# Patient Record
Sex: Female | Born: 1945 | Race: Black or African American | Hispanic: No | Marital: Single | Smoking: Current every day smoker
Health system: Southern US, Community
[De-identification: ages and names within clinical notes are randomized; demographics above are authoritative.]

## PROBLEM LIST (undated history)

## (undated) DIAGNOSIS — I10 Essential (primary) hypertension: Secondary | ICD-10-CM

---

## 2005-04-20 ENCOUNTER — Ambulatory Visit: Payer: Self-pay

## 2005-09-17 ENCOUNTER — Inpatient Hospital Stay: Payer: Self-pay | Admitting: Internal Medicine

## 2005-09-17 ENCOUNTER — Other Ambulatory Visit: Payer: Self-pay

## 2007-01-03 ENCOUNTER — Ambulatory Visit: Payer: Self-pay | Admitting: Family Medicine

## 2007-02-14 ENCOUNTER — Encounter: Payer: Self-pay | Admitting: Family Medicine

## 2008-12-25 ENCOUNTER — Ambulatory Visit: Payer: Self-pay | Admitting: Family Medicine

## 2009-03-01 ENCOUNTER — Encounter: Payer: Self-pay | Admitting: Family Medicine

## 2009-03-05 ENCOUNTER — Emergency Department: Payer: Self-pay | Admitting: Emergency Medicine

## 2009-03-30 ENCOUNTER — Encounter: Payer: Self-pay | Admitting: Family Medicine

## 2009-04-05 ENCOUNTER — Emergency Department: Payer: Self-pay | Admitting: Unknown Physician Specialty

## 2009-04-18 ENCOUNTER — Inpatient Hospital Stay: Payer: Self-pay | Admitting: Internal Medicine

## 2009-05-01 ENCOUNTER — Emergency Department: Payer: Self-pay | Admitting: Emergency Medicine

## 2009-06-01 ENCOUNTER — Inpatient Hospital Stay: Payer: Self-pay | Admitting: Internal Medicine

## 2009-08-07 ENCOUNTER — Emergency Department: Payer: Self-pay | Admitting: Emergency Medicine

## 2009-09-20 ENCOUNTER — Emergency Department: Payer: Self-pay | Admitting: Emergency Medicine

## 2011-11-22 ENCOUNTER — Inpatient Hospital Stay: Payer: Self-pay | Admitting: Internal Medicine

## 2011-11-22 LAB — ACETAMINOPHEN LEVEL: Acetaminophen: <2 ug/mL

## 2011-11-22 LAB — CBC
HCT: 49.4 % — ABNORMAL HIGH (ref 35.0–47.0)
MCHC: 32.3 g/dL (ref 32.0–36.0)
Platelet: 120 10*3/uL — ABNORMAL LOW (ref 150–440)
RBC: 5.31 10*6/uL — ABNORMAL HIGH (ref 3.80–5.20)
WBC: 9.5 10*3/uL (ref 3.6–11.0)

## 2011-11-22 LAB — URINALYSIS, COMPLETE
Bilirubin,UR: NEGATIVE
Leukocyte Esterase: NEGATIVE
Ph: 5 (ref 4.5–8.0)
Protein: 30
RBC,UR: 3 /HPF (ref 0–5)

## 2011-11-22 LAB — COMPREHENSIVE METABOLIC PANEL
Albumin: 3.7 g/dL (ref 3.4–5.0)
Alkaline Phosphatase: 70 U/L (ref 50–136)
BUN: 23 mg/dL — ABNORMAL HIGH (ref 7–18)
Calcium, Total: 9.9 mg/dL (ref 8.5–10.1)
Creatinine: 1.05 mg/dL (ref 0.60–1.30)
EGFR (African American): >60
EGFR (Non-African Amer.): 56 — ABNORMAL LOW
Glucose: 118 mg/dL — ABNORMAL HIGH (ref 65–99)
SGOT(AST): 28 U/L (ref 15–37)
Sodium: 136 mmol/L (ref 136–145)
Total Protein: 7.8 g/dL (ref 6.4–8.2)

## 2011-11-22 LAB — DRUG SCREEN, URINE
Amphetamines, Ur Screen: NEGATIVE (ref ?–1000)
Barbiturates, Ur Screen: NEGATIVE (ref ?–200)
Benzodiazepine, Ur Scrn: NEGATIVE (ref ?–200)
Cannabinoid 50 Ng, Ur ~~LOC~~: NEGATIVE (ref ?–50)
Methadone, Ur Screen: NEGATIVE (ref ?–300)
Phencyclidine (PCP) Ur S: NEGATIVE (ref ?–25)
Tricyclic, Ur Screen: NEGATIVE (ref ?–1000)

## 2011-11-22 LAB — ETHANOL
Ethanol %: <0.003 % (ref 0.000–0.080)
Ethanol: <3 mg/dL

## 2011-11-22 LAB — SALICYLATE LEVEL: Salicylates, Serum: 3.7 mg/dL — ABNORMAL HIGH

## 2011-11-23 LAB — BASIC METABOLIC PANEL
Anion Gap: 17 — ABNORMAL HIGH (ref 7–16)
BUN: 26 mg/dL — ABNORMAL HIGH (ref 7–18)
Calcium, Total: 9.3 mg/dL (ref 8.5–10.1)
Co2: 18 mmol/L — ABNORMAL LOW (ref 21–32)
Creatinine: 0.89 mg/dL (ref 0.60–1.30)
EGFR (African American): >60
EGFR (Non-African Amer.): >60
Glucose: 102 mg/dL — ABNORMAL HIGH (ref 65–99)
Sodium: 137 mmol/L (ref 136–145)

## 2011-11-23 LAB — CK TOTAL AND CKMB (NOT AT ARMC)
CK, Total: 74 U/L (ref 21–215)
CK, Total: 130 U/L (ref 21–215)
CK, Total: 44 U/L (ref 21–215)
CK-MB: 7 ng/mL — ABNORMAL HIGH (ref 0.5–3.6)
CK-MB: 7.2 ng/mL — ABNORMAL HIGH (ref 0.5–3.6)
CK-MB: 4.7 ng/mL — ABNORMAL HIGH (ref 0.5–3.6)

## 2011-11-23 LAB — CBC WITH DIFFERENTIAL/PLATELET
Basophil %: 0.5 %
Eosinophil #: 0 10*3/uL (ref 0.0–0.7)
Eosinophil %: 0.1 %
HGB: 15.1 g/dL (ref 12.0–16.0)
Lymphocyte #: 1.8 10*3/uL (ref 1.0–3.6)
Lymphocyte %: 18.8 %
MCH: 29.9 pg (ref 26.0–34.0)
Monocyte #: 0.8 10*3/uL — ABNORMAL HIGH (ref 0.0–0.7)
Neutrophil %: 72 %
Platelet: 121 10*3/uL — ABNORMAL LOW (ref 150–440)
RBC: 5.05 10*6/uL (ref 3.80–5.20)
RDW: 17 % — ABNORMAL HIGH (ref 11.5–14.5)
WBC: 9.4 10*3/uL (ref 3.6–11.0)

## 2011-11-23 LAB — TROPONIN I: Troponin-I: 0.09 ng/mL — ABNORMAL HIGH

## 2011-11-24 LAB — BASIC METABOLIC PANEL
Anion Gap: 11 (ref 7–16)
BUN: 23 mg/dL — ABNORMAL HIGH (ref 7–18)
Calcium, Total: 8.9 mg/dL (ref 8.5–10.1)
Co2: 24 mmol/L (ref 21–32)
EGFR (African American): >60
Potassium: 4.1 mmol/L (ref 3.5–5.1)

## 2011-11-27 LAB — PLATELET COUNT: Platelet: 154 10*3/uL (ref 150–440)

## 2011-11-28 LAB — CBC WITH DIFFERENTIAL/PLATELET
Eosinophil %: 0.1 %
HCT: 40 % (ref 35.0–47.0)
HGB: 12.7 g/dL (ref 12.0–16.0)
Lymphocyte #: 2.1 10*3/uL (ref 1.0–3.6)
Lymphocyte %: 23.2 %
MCH: 29.8 pg (ref 26.0–34.0)
MCHC: 31.8 g/dL — ABNORMAL LOW (ref 32.0–36.0)
Monocyte %: 12.9 %

## 2011-11-28 LAB — CULTURE, BLOOD (SINGLE)

## 2011-11-28 LAB — BASIC METABOLIC PANEL
Calcium, Total: 8.7 mg/dL (ref 8.5–10.1)
Co2: 25 mmol/L (ref 21–32)
Creatinine: 0.9 mg/dL (ref 0.60–1.30)
EGFR (African American): >60
Glucose: 115 mg/dL — ABNORMAL HIGH (ref 65–99)
Osmolality: 283 (ref 275–301)
Potassium: 4 mmol/L (ref 3.5–5.1)
Sodium: 141 mmol/L (ref 136–145)

## 2011-12-02 LAB — CBC WITH DIFFERENTIAL/PLATELET
Basophil #: 0 10*3/uL (ref 0.0–0.1)
Eosinophil #: 0.1 10*3/uL (ref 0.0–0.7)
HCT: 41.8 % (ref 35.0–47.0)
HGB: 13.5 g/dL (ref 12.0–16.0)
Lymphocyte #: 2.9 10*3/uL (ref 1.0–3.6)
MCV: 92 fL (ref 80–100)
Neutrophil #: 8.8 10*3/uL — ABNORMAL HIGH (ref 1.4–6.5)
RBC: 4.54 10*6/uL (ref 3.80–5.20)
WBC: 13.1 10*3/uL — ABNORMAL HIGH (ref 3.6–11.0)

## 2011-12-02 LAB — URINALYSIS, COMPLETE
Bilirubin,UR: NEGATIVE
Nitrite: POSITIVE
Protein: NEGATIVE
RBC,UR: 4 /HPF (ref 0–5)
Specific Gravity: 1.013 (ref 1.003–1.030)
WBC UR: 310 /HPF (ref 0–5)

## 2011-12-02 LAB — BASIC METABOLIC PANEL
BUN: 21 mg/dL — ABNORMAL HIGH (ref 7–18)
Calcium, Total: 9.1 mg/dL (ref 8.5–10.1)
Chloride: 103 mmol/L (ref 98–107)
Creatinine: 0.78 mg/dL (ref 0.60–1.30)
Osmolality: 276 (ref 275–301)
Potassium: 5.1 mmol/L (ref 3.5–5.1)

## 2011-12-03 LAB — CBC WITH DIFFERENTIAL/PLATELET
Basophil #: 0.1 10*3/uL (ref 0.0–0.1)
Basophil %: 0.6 %
Eosinophil #: 0 10*3/uL (ref 0.0–0.7)
Eosinophil %: 0.4 %
HGB: 13.4 g/dL (ref 12.0–16.0)
Lymphocyte #: 2.8 10*3/uL (ref 1.0–3.6)
Lymphocyte %: 24.5 %
MCV: 93 fL (ref 80–100)
Monocyte #: 1.3 10*3/uL — ABNORMAL HIGH (ref 0.0–0.7)
Neutrophil %: 63.2 %
Platelet: 217 10*3/uL (ref 150–440)
RBC: 4.57 10*6/uL (ref 3.80–5.20)
WBC: 11.6 10*3/uL — ABNORMAL HIGH (ref 3.6–11.0)

## 2011-12-04 LAB — POTASSIUM: Potassium: 5.1 mmol/L (ref 3.5–5.1)

## 2012-09-10 ENCOUNTER — Ambulatory Visit: Payer: Self-pay | Admitting: Family Medicine

## 2013-02-10 IMAGING — CR DG CHEST 2V
1 series · 2 of 2 positions shown · non-contrast
Comparison: none

REASON FOR EXAM: ams
COMMENTS:   May transport without cardiac monitor

PROCEDURE:     DXR - DXR CHEST PA (OR AP) AND LATERAL  - November 22, 2011  [DATE]
RESULT:     Comparison is made to the study 07 August, 2009.
There is a fracture in the posterior right ninth rib without evidence of
pleural effusion or definite pneumothorax. The heart size appears normal.

[Series 1: x chest ap · 0.14mm/px · 2 of 2 slices shown]
[im 1/2]
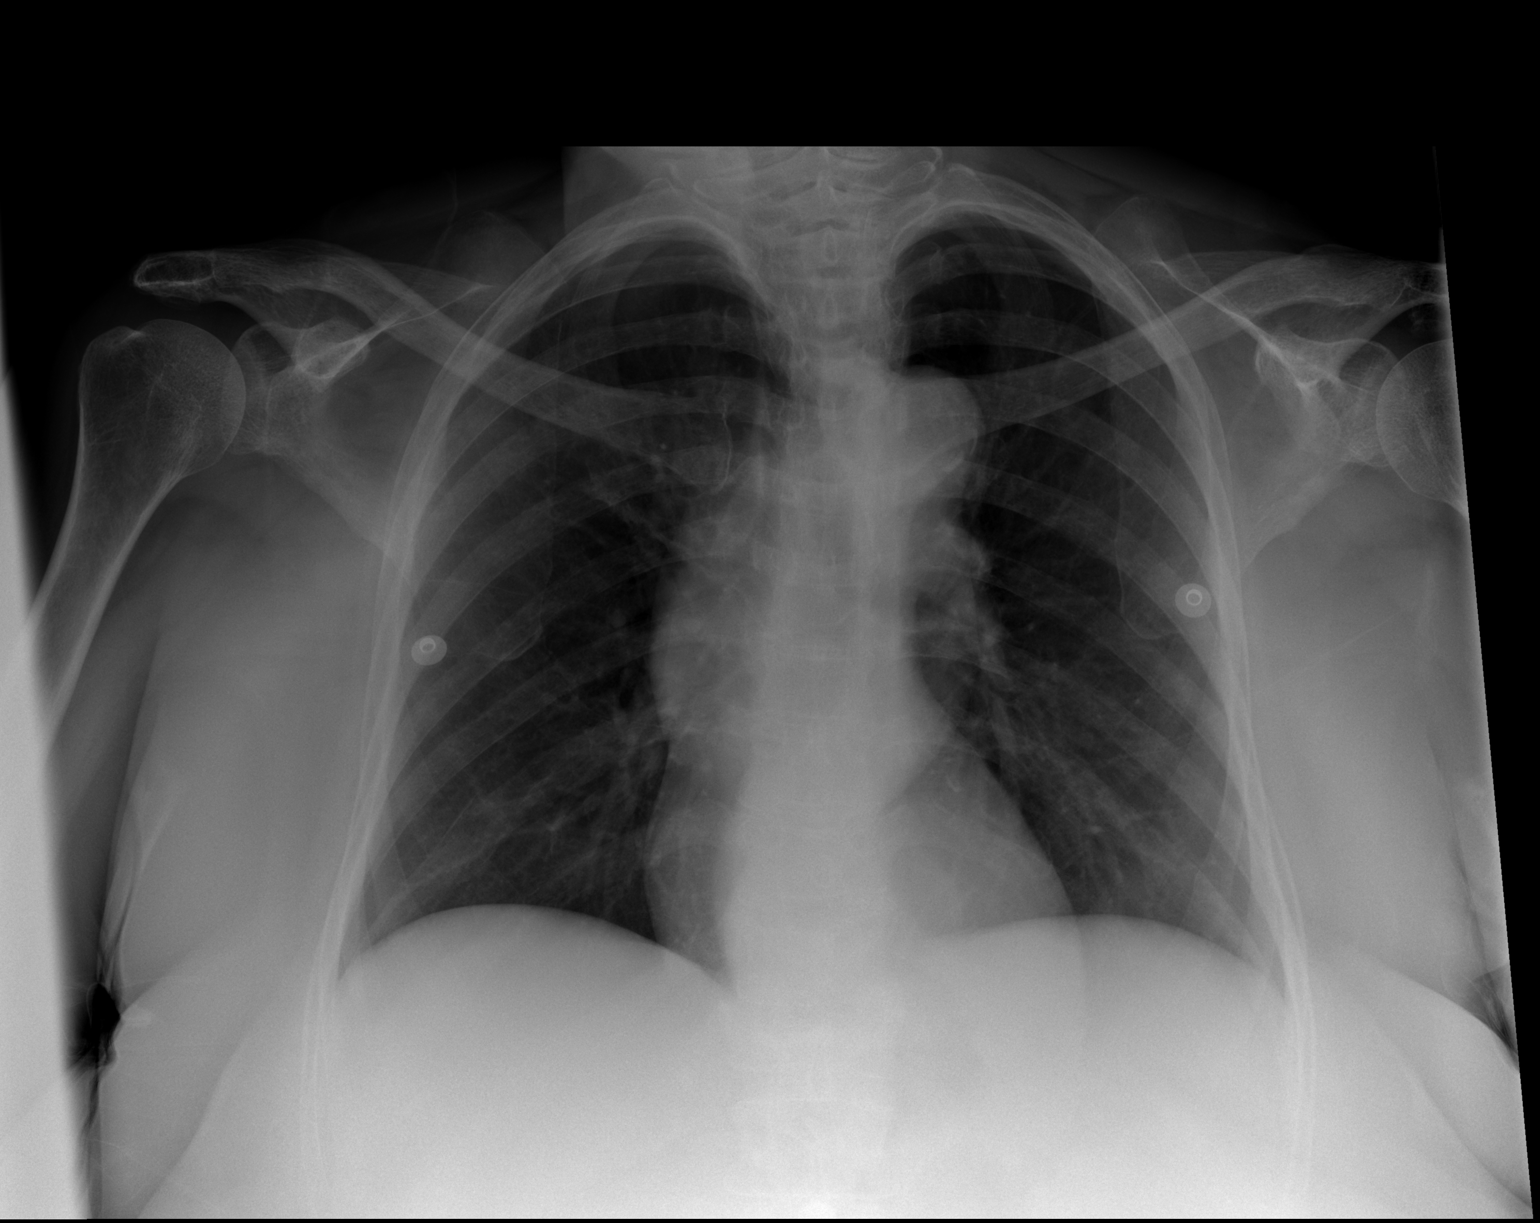
[im 2/2]
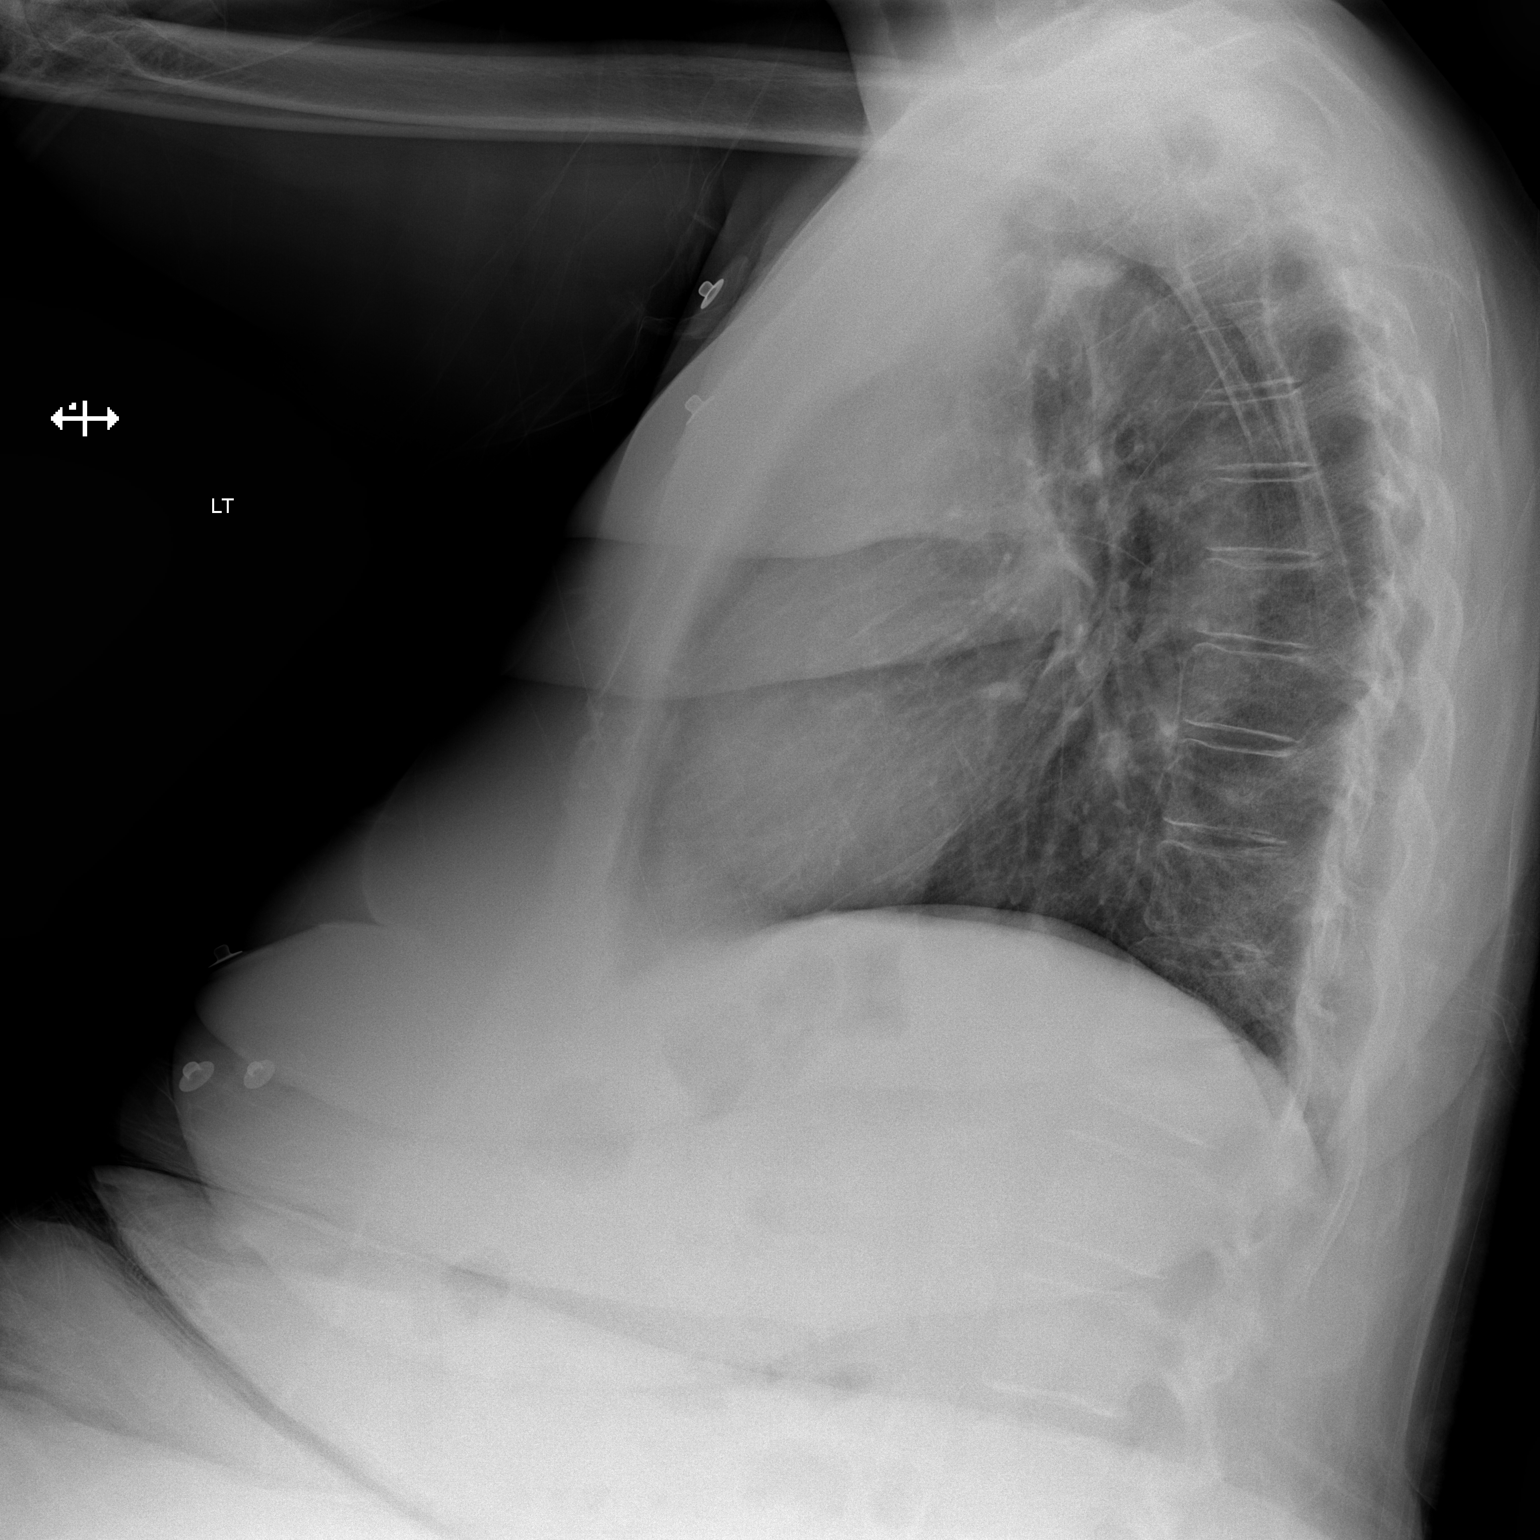

[2 of 2 positions shown; findings below may reference images not displayed]

IMPRESSION: Posterior right ninth rib fracture.

## 2013-02-10 IMAGING — CT CT CERVICAL SPINE WITHOUT CONTRAST
1 series · 12 of 14 positions shown, 15 images · non-contrast
Comparison: None

REASON FOR EXAM: fall with head injury and AMS, cannot r/o injury w/ nexus
COMMENTS:

PROCEDURE:     CT  - CT CERVICAL SPINE WO  - November 22, 2011  [DATE]
RESULT:     Clinical Indication: Trauma
TECHNIQUE: Multiple axial CT images from the skull base to the mid vertebral
body of T1. obtained with sagittal and coronal reformatted images provided.

[Series 7: axial · axial · 0.33mm/px · z∈[-347,-211]mm · 12 of 84 slices shown, 15 images]
[im 7/84  soft-tissue]
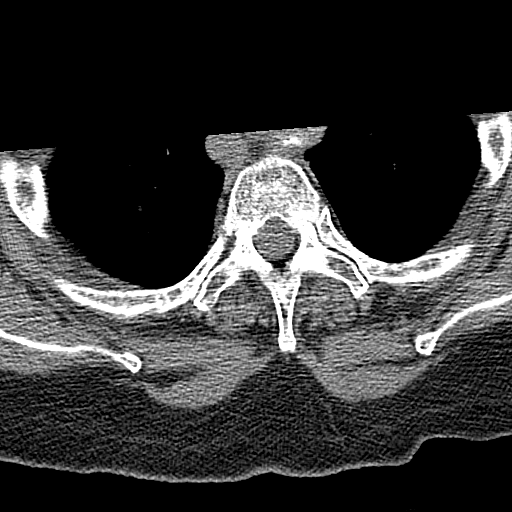
[im 7/84  bone]
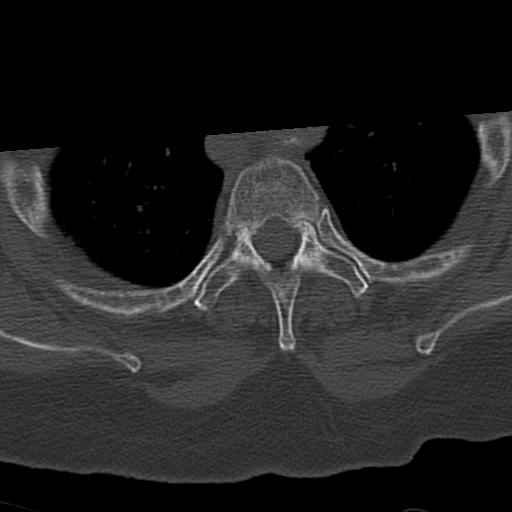
[im 13/84  bone]
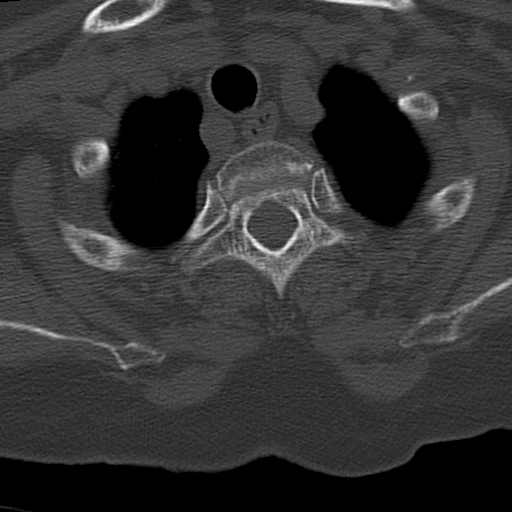
[im 20/84  bone]
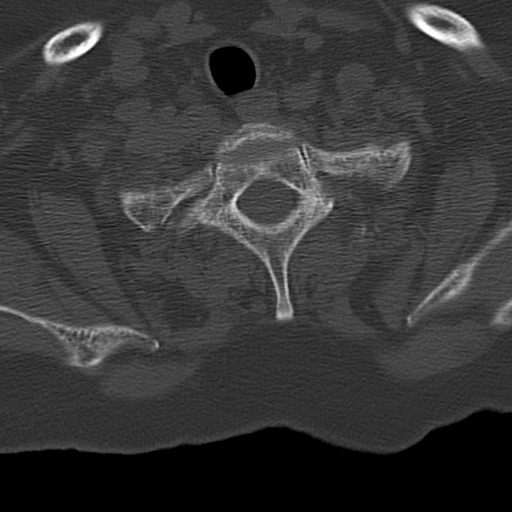
[im 26/84  bone]
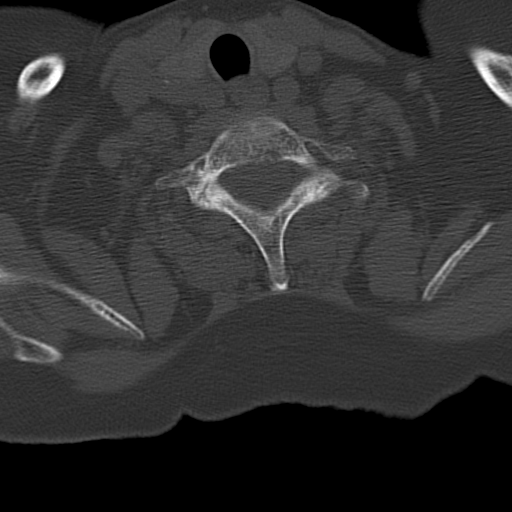
[im 32/84  soft-tissue]
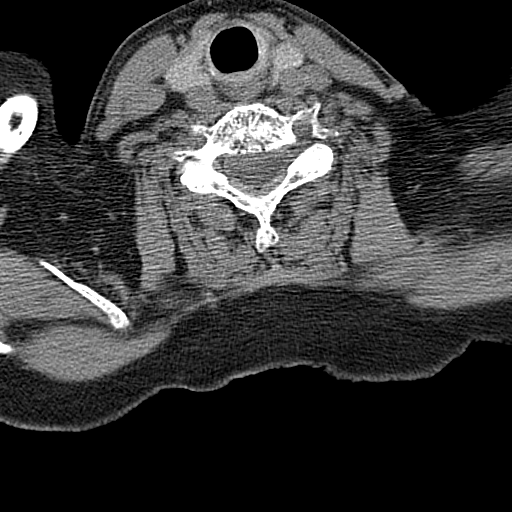
[im 32/84  bone]
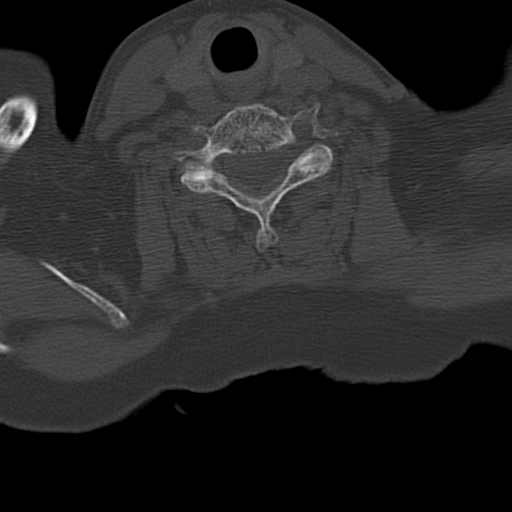
[im 39/84  bone]
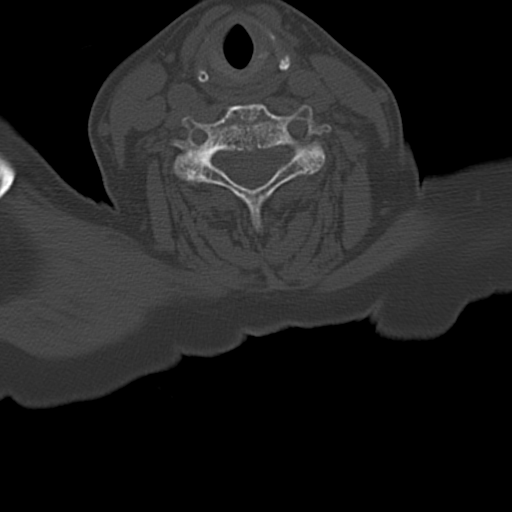
[im 45/84  bone]
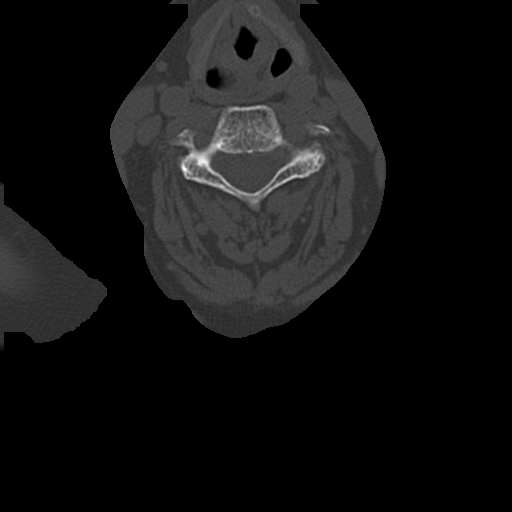
[im 52/84  bone]
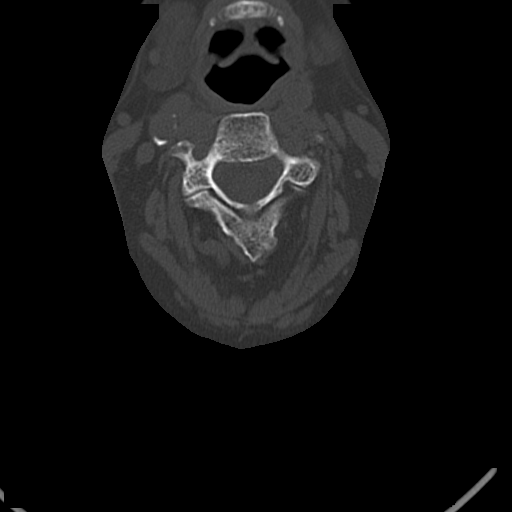
[im 58/84  soft-tissue]
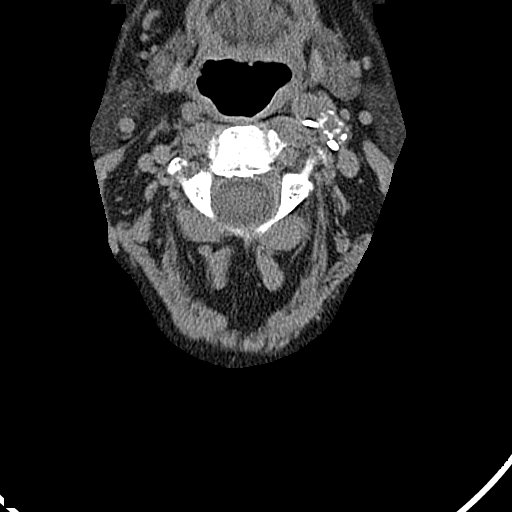
[im 58/84  bone]
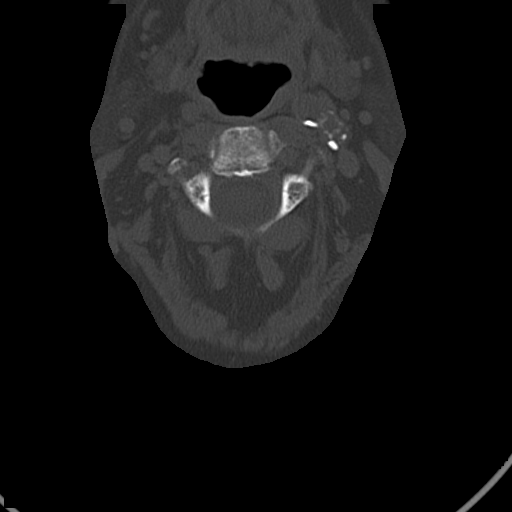
[im 64/84  bone]
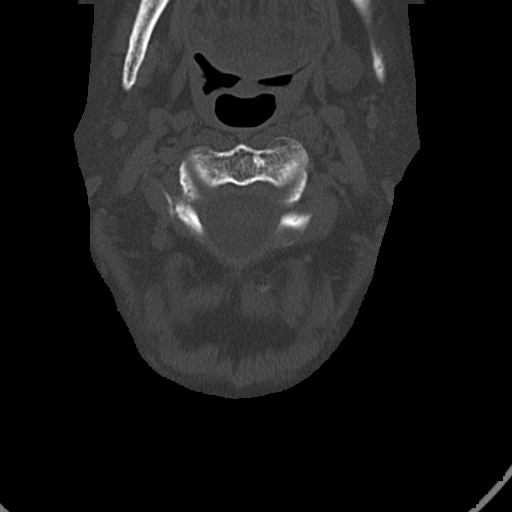
[im 71/84  bone]
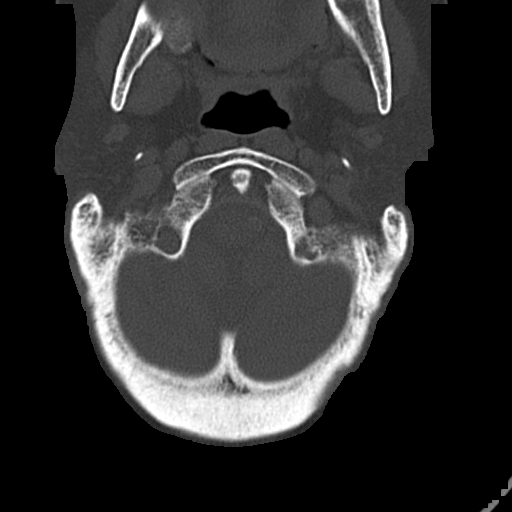
[im 77/84  bone]
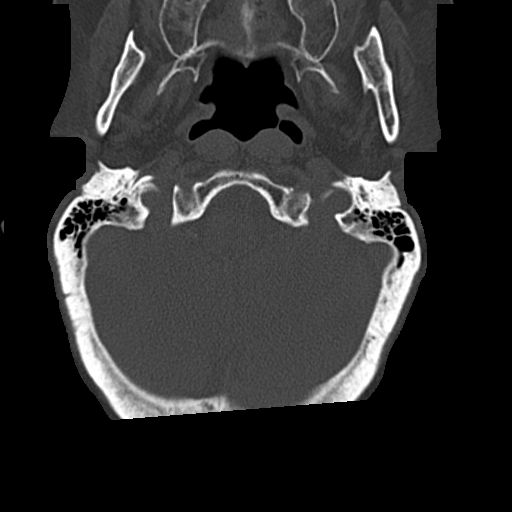

[12 of 14 positions shown; findings below may reference images not displayed]

FINDINGS: The alignment is anatomic. The vertebral body heights are maintained. There
is no acute fracture or static listhesis. The prevertebral soft tissues are
normal. The intraspinal soft tissues are not fully imaged on this
examination due to poor soft tissue contrast, but there is no soft tissue
gross abnormality.

The disc spaces are maintained.

The visualized portions of the lung apices demonstrate no focal abnormality.

There is bilateral carotid artery atherosclerosis.
IMPRESSION: 1. No acute osseous injury of the cervical spine.

2. Ligamentous injury is not evaluated. If there is high clinical concern
for ligamentous injury, consider MRI or flexion/extension radiographs as
clinically indicated and tolerated.

## 2014-08-28 ENCOUNTER — Other Ambulatory Visit (HOSPITAL_COMMUNITY): Payer: Self-pay | Admitting: *Deleted

## 2014-08-28 DIAGNOSIS — R609 Edema, unspecified: Secondary | ICD-10-CM

## 2014-08-31 ENCOUNTER — Ambulatory Visit: Payer: Self-pay | Admitting: Orthopedic Surgery

## 2014-09-08 ENCOUNTER — Other Ambulatory Visit: Payer: Self-pay

## 2014-09-08 ENCOUNTER — Other Ambulatory Visit (INDEPENDENT_AMBULATORY_CARE_PROVIDER_SITE_OTHER): Payer: Medicare Other

## 2014-09-08 DIAGNOSIS — I340 Nonrheumatic mitral (valve) insufficiency: Secondary | ICD-10-CM

## 2014-09-08 DIAGNOSIS — R609 Edema, unspecified: Secondary | ICD-10-CM

## 2015-02-21 NOTE — Consult Note (Signed)
Brief Consult Note: Diagnosis: delirium due to medication misuse, resolving. Polysubstance dep, dementia.   Patient was seen by consultant.   Consult note dictated.   Comments: Psychiatry: Patient seen. I also spoke with her son. Patient has been increasingly confused and intermittantly agitated. Son says he has clear reason to believe she misuses her prescription medicatio including the trazadone, ultram and neurontin. Also she still drinks regularly and occasionally abuses cocaine. Patient currently having signs of both delirium and dementia. Sounds like the delirium is prob better than it was earlier.  1)Stop the ultram and minimize anything that might be psychoactive. She isn't having physical withdrawl. 2) She is not in any shape at this stage of her life to benifit from any SA treatment other than simply being prevented from having access to substances.  3) I agree she would not be safe going home. Suggest placement.  Electronic Signatures: Audery Amellapacs, Keana Dueitt T (MD)  (Signed 24-Jan-13 19:37)  Authored: Brief Consult Note   Last Updated: 24-Jan-13 19:37 by Audery Amellapacs, Eryck Negron T (MD)

## 2015-02-21 NOTE — Consult Note (Signed)
PATIENT NAME:  Carla Stanley, Carla Stanley MR#:  [id number] DATE OF BIRTH:  July 21, 1946  DATE OF CONSULTATION:  11/23/2011  REFERRING PHYSICIAN:   CONSULTING PHYSICIAN:  Audery AmelJohn T. Clapacs, MD  IDENTIFYING INFORMATION AND REASON FOR CONSULT: 69 year old woman brought in to the hospital by her son because of acute confusion and instability. Consult requested because of mental status change, history of substance abuse.   HISTORY OF PRESENT ILLNESS: Information obtained from the patient and from the old chart as well as from a conversation I had with her son. The patient is a questionable historian at best. She does admit that she has been weak recently but cannot give me much explanation for it. Her chief complaint for being in the hospital is that she has "spots on her legs". She admits that she still drinks alcohol, mostly beer and sometimes liquor, regularly. She cannot remember her last drink but says that she usually has some most days. She admits that she continues to use cocaine occasionally but cannot remember when she last had any. She denies that she has been abusing any narcotics. She does not report any mood symptoms. Does not report any suicidality. The son reports that he got a call today that the patient was confused, agitated and had been found laying on the floor for hours unable to get up. The son reports that the patient has a history of intermittent confusion and this has been progressing recently. He says he has reason to believe that she has been abusing her prescription medications. He found many bottles of medication around the house which had fewer pills in them then they ought to. He told me he thinks that she has been getting pain medication prescribed more than once a week. He says that he has watched her gobble down pills indiscriminately. He also was aware that she drinks still and will get cocaine at times. He is very concerned that she is unable to take care of herself. When he found her today he  says that she was very confused, could not recognize him, could not talk coherently, that her eyes were disconjugate. The son would really like to have her placed in an assisted living facility.   PAST PSYCHIATRIC HISTORY: Patient has a well documented long history of drug abuse including a past history of heroin dependence and abuse of other opiates. She has had visits to the hospital in the past with similar degrees of confusion probably under the same circumstances. A couple of years ago it was also recommended that she be placed in a facility but somehow this did not happen. No identified history of suicide behavior. Has had diagnosis of depression in the past but it is not clear that those have been really separate from her substance abuse.    PAST MEDICAL HISTORY:  1. High blood pressure. 2. Chronic pain. 3. History of alcohol abuse.  4. Possible hepatitis.  5. Has had falls in the past.  6. History of a stroke in the past.   SOCIAL HISTORY: Apparently she lives more or less on her own right now. She has a female friend who is in and out of the house. Her daughter lives nearby and checks on her. There is a home health aide who comes in the mornings. Son lives in [place].   REVIEW OF SYSTEMS: Patient only complained to me of having spots on her legs. She said she was not in any pain, denied any shortness of breath, denied any nausea. She did  say that her appetite had been poor recently and that she feels tired. Denies depressed mood. Denies any suicidal ideation. Otherwise noncontributory.   MENTAL STATUS EXAM: Patient was awake and cooperative. Made good eye contact. Psychomotor activity very limited. Speech was slurred and difficult to understand, also very slow and quiet. Affect was flat and seemed confused. Mood was stated as being okay. Thoughts were slow and confused. She was oriented to the place and to the basic situation although there were a few moments when she seemed to get  distracted and muttered to herself. She denies suicidal or homicidal ideation. Denies that she has been hallucinating. On Mini-Mental Status exam she scored 12 out of 30.   MEDICATIONS: The medication list in the admission note includes:  1. Neurontin 800 mg 4 times a day.  2. Trazodone 200 mg at night. 3. Cymbalta 30 mg twice a day. 4. Quinapril 20 mg a day.  5. KCl 20 mEq a day.  6. Tramadol 50 mg every six hours as needed for pain.  7. Toprol 25 mg extended release a day. 8. Lasix 40 mg a day. 9. Ibuprofen as needed.   LABORATORY, DIAGNOSTIC AND RADIOLOGICAL DATA: The patient's drug screen is negative on admission. Her alcohol level was undetectable. Other abnormalities pertaining to medical illnesses noted but those are the most pertinent ones to her mental status currently.   ASSESSMENT: This is a 69 year old woman who currently is clinically showing symptoms of both delirium and dementia. Based on the history I am hearing it sounds like she was even more delirious when she came into the hospital and that it is clearing up. Obviously there can be multiple medical reasons for delirium which are being worked up. The most obvious acute one that we know of would be that she is known to abuse her medication and alcohol and cocaine. Patient is also significantly demented. It is possible that this might improve with some time but I suspect that she has significant impairment based on current exam and her son's history. Right now her vital signs are stable. There is no sign that she is having alcohol withdrawal symptoms. I doubt that this delirium is delirium tremens, especially since it sounds like it has been getting better today without the use of benzodiazepines.   TREATMENT PLAN AND RECOMMENDATIONS: I would minimize the use of any kind of medications that could be psychoactive. I think that has probably already been done. I am going to go ahead and discontinued the Ultram just so that she does not  get any more of that. She does not look like she needs specific detox treatment right now. I do not think that in her mental state that even when she comes back to baseline that she would benefit from any kind of substance abuse treatment except being put in an environment where she cannot abuse substances. I do not think there is an acute mood disorder or psychotic disorder. I agree with her son that she is really not safe back at home and that at this point she needs to be placed in facility for her own safety or these kinds of things are just going to continue to happen and get worse. Actually the patient was somewhat agreeable to that as well when I spoke to her.   DIAGNOSIS PRINCIPLE AND PRIMARY:  AXIS I: Delirium due to medication abuse, resolving.   SECONDARY DIAGNOSES:  AXIS I:  1. 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  2. Polysubstance dependence.  AXIS II: Deferred.   AXIS III: Multiple medical problems.   AXIS IV: Moderate to severe-Ongoing stress from poor social situation.     AXIS V: Functioning at time of evaluation 20.  ____________________________ Audery Amel, MD jtc:cms D: 11/23/2011 19:49:12 ET T: 11/24/2011 07:34:01 ET JOB#: [id number]  cc: Audery Amel, MD, <Dictator> Audery Amel MD ELECTRONICALLY SIGNED 11/24/2011 10:12

## 2015-02-21 NOTE — Discharge Summary (Signed)
PATIENT NAME:  Carla Stanley, Carla Stanley MR#:  [id number] DATE OF BIRTH:  October 13, 1946  DATE OF ADMISSION:  11/22/2011 DATE OF DISCHARGE:  12/07/2011  ADMITTING PHYSICIAN:  Dr. Darrick Meigs REFERRING PHYSICIAN:  Dr. Glenetta Hew  PRIMARY CARE PHYSICIAN: Dr. Maryruth Hancock   ADMITTING DIAGNOSES:  1. Altered mental status.  2. Polysubstance abuse.   DISCHARGE DIAGNOSES:  1. Acute encephalopathy, felt to be due to substance abuse and alcohol abuse, now mental status significantly improved.  2. Urinary tract infection secondary to E. coli currently on treatment with Cipro.  3. Accelerated hypertension felt to be due to agitation, now blood pressure is running low.  Her antihypertensives are discontinued.  4. Slightly elevated troponins due to malignant hypertension.  5. Tobacco abuse.  6. Chronic pain syndrome.  7. Depression.  8. Likely chronic obstructive pulmonary disease with wheezing noted during hospitalization.  9. Hyperglycemia, felt to be reactive.  10. Debilitation.   CONSULTANTS: Dr. Toni Amend, psychiatry.   PERTINENT LABS/EVALUATIONS: BMP on admission: Glucose 118, BUN 23, creatinine 1.05, sodium 136, potassium 3.6, CO2 99. Alcohol level was less than 3. LFTs were normal. Troponin was 0.12, 0.15. TUDS was negative. Admitting WBC count 9.5, hemoglobin 16, platelet count 120, platelet count on 02/03 was normal at 217. Blood cultures: No growth. Acetaminophen level less than 2. Salicylate levels 3.7. CT scan of the C-spine was negative. CT scan of the head shows chronic ischemic changes. No acute abnormality compared to previous CT. EKG: Sinus tachycardia, right atrial enlargement, nonspecific ST-T wave changes.   HOSPITAL COURSE: Please see History and Physical done by the admitting physician. The patient is a 69 year old African American female with history of hypertension and polysubstance abuse who was brought in by her son with altered mental status. The patient was verbally abusive and  hallucinating. She was admitted to the hospitalist service for further evaluation.  1. Altered mental status felt to be related to substance abuse: Her urine tox screen was negative. CT scan of the head was negative. Urinalysis was also negative. Go home. The patient also has a history of alcohol abuse which probably contributed to her current symptoms. She was seen by psychiatry who recommended the patient was not safe to go home and needed inpatient rehab. She was continued on CIWA protocol. Also it was recommended to wean her off Ultram, which has been decreased. The patient's mental status currently is significantly improved.  2. The patient was also noted to have accelerated hypertension and was started on Norvasc and quinapril but her blood pressure is now normalized so these have been discontinued. The patient also was noted to have a troponin in the setting of malignant hypertension. It was trending down. At this time it was felt to be demand ischemia. The patient has not had any chest pains.  3. The patient also was noted to have wheezing and congestion and does have a history of tobacco abuse. She likely has chronic obstructive pulmonary disease and was treated with prednisone and p.r.n. nebulizers. She likely had acute chronic obstructive pulmonary disease exacerbation. She is doing well on inhalers. At this time she is stable to be discharged to rehab.   DISCHARGE DIET: Mechanical soft, low sodium diet.  Use thin liquids, ground meats with gravy, aspiration precautions.  ACTIVITY: As tolerated. PT and OT evaluation and treatment.   DISCHARGE MEDICATIONS:  1. Aspirin 81, 1 tab p.o. daily.  2. Cymbalta 30, 1 tab p.o. b.i.d.  3. Spiriva 18 mcg daily.  4. Arthrotec 50/200,1 tab  p.o. b.i.d.  5. Nicotine patch 21 mg transdermally daily.  6. Tylenol 650 q. 6 p.r.n. pain.  7. Protonix 40 daily.  8. Tramadol 50 p.o. q. 12 p.r.n. pain.  9. Advair 250/50  INH q.12. 10. Ventolin 2 puffs q. 6 while  awake.  11. Cipro 500 p.o. q.12 hours times three days.   DISCHARGE FOLLOWUP: Follow up with Dr. Maryruth HancockSallie Zyniah Stanley once discharged from nursing home.  time spent 36 min ____________________________ [name], MD shp:bjt D: 12/07/2011 12:17:06 ET T: 12/07/2011 12:58:25 ET JOB#: [id number]  cc: Carla Stanley H. Allena KatzPatel, MD, <Dictator> Carla "Carla" Allena KatzPatel, MD Charise CarwinSHREYANG H Kadijah Shamoon MD ELECTRONICALLY SIGNED 12/16/2011 10:02

## 2015-02-21 NOTE — H&P (Signed)
PATIENT NAME:  Carla Stanley, Carla Stanley MR#:  [id number] DATE OF BIRTH:  [birth year]  DATE OF ADMISSION:  11/22/2011  REFERRING PHYSICIAN: ER physician Dr. Glenetta HewMcLaurin   PRIMARY CARE PHYSICIAN: Maryruth HancockSallie Patel, MD   CHIEF COMPLAINT: Altered mental status, polysubstance abuse.   HISTORY OF PRESENT ILLNESS: The patient is a 69 year old female with past medical history of hypertension and polysubstance abuse who was brought in by her son for altered mental status. The patient's son says that he thinks his mother is incompetent to make a decision. The patient's son lives at [place] and was called this morning by the patient's home health aide because the patient was not acting right. As per the home health aide, the patient was cursing and shouting and being verbally abusive. When the patient's son spoke to her, the patient appeared to be hallucinating. She was saying that she is going to visit her mother and aunt, both of whom are deceased. The patient felt that she may be suicidal, however, the patient currently herself denies any suicidal ideation. The patient's daughter reported to the patient's son that the patient had fallen at around 7:30 a.m. this morning and was on the floor until the home health aide arrived at 10 o'clock. They also feel that the patient is not taking her medications the way she is supposed to be and may be taking extra doses of certain medications. The patient was last admitted for something similar in August of 2010. The patient herself is oriented to self and very slow to respond. She is unable to provide me a good history.   ALLERGIES: Motrin, Flexeril.   CURRENT MEDICATIONS:  1. Neurontin 800 mg q.i.d.  2. Trazodone 200 mg at bedtime.  3. Cymbalta 30 mg b.i.d.  4. Quinapril 20 mg daily.  5. KCl 20 mEq daily.  6. Tramadol 50 mg q.6 hours p.r.n.  7. Toprol-XL 25 mg daily.  8. Lasix 40 mg daily.  9. Ibuprofen p.r.n.   PAST MEDICAL HISTORY:  1. History of prior admission for fall  and rhabdomyolysis with elevated cardiac enzymes, renal insufficiency with dehydration. Cardiac catheterization 03/2009 showed normal coronaries. 2. Chronic pain, on narcotics.  3. History of alcohol abuse.  4. Questionable history of hepatitis B and C.  5. Hypertension.  6. Smoking. 7. History of multiple rib fractures due to fall.  8. History of IV drug abuse with heroin. The patient quit five years ago. 9. Ongoing cocaine abuse. 10. Elevated liver enzymes thought to be related to alcohol.  11. Bipolar disorder with history of suicide attempt. 12. History of left parietal CVA. 13. Normocytic anemia. 14. History of DTs. 15. History of altered mental status due to urinary tract infection. 16. Iatrogenic hyponatremia.   PAST SURGICAL HISTORY: 1. Right hip replacement. 2. Hysterectomy. 3. Left cataract surgery.    SOCIAL HISTORY: The patient lives alone, has a home health aide. She smokes 1 to 2 packs per day. The patient also drinks 6 pack 40 ounce beers daily. As per the son, she was also using hard liquor in the past. The patient was an IV drug abuser. She used heroin. The patient was an IV heroin abuser and quit five years ago. She continues to use cocaine and last used about two days ago.   FAMILY HISTORY: Father had history of alcohol abuse and hypertension. Mother had no medical problems.   REVIEW OF SYSTEMS: The patient is lethargic, oriented only to self. She is unable to provide me with a reliable  review of systems.     PHYSICAL EXAMINATION:   VITAL SIGNS: Temperature 97.8, heart rate 98, respiratory rate 20, blood pressure 199/123, pulse oximetry 99% on room air.   GENERAL: The patient is an elderly chronically ill appearing African American female laying in bed.   HEAD: There is a scalp hematoma on the top of her head from where the patient fell and hit her head this morning.   EYES: There is mild pallor. No icterus or cyanosis. Pupils equal, round, and reactive to  light and accommodation. Extraocular movements intact.   ENT: Dry mucous membranes. Coated tongue. No oropharyngeal erythema or thrush.   NECK: Supple. No masses. No JVD. No thyromegaly. No lymphadenopathy.   CHEST WALL: No tenderness to palpation. Not using accessory muscles of respiration. No intercostal retractions.   LUNGS: Bilateral basal crepitations. No wheezing, rales, or rhonchi.   CARDIOVASCULAR: S1, S2 regular. No murmur, rubs, or gallops.   ABDOMEN: Soft, nontender, nondistended. No guarding. No rigidity. No organomegaly. Normal bowel sounds.   SKIN: No rashes or lesions.  PERIPHERIES: Trace pedal edema. 1+ pedal pulses.   MUSCULOSKELETAL: No cyanosis or clubbing.   NEUROLOGIC: The patient is lethargic, oriented only to self. Follows simple commands. Moves all four extremities.   PSYCH: Unable to evaluate.   LABORATORY, DIAGNOSTIC, AND RADIOLOGICAL DATA: Acetaminophen level less than 2. Alcohol level less than 0.003. Salicylates 3.7. Glucose 118. BUN 23. Normal electrolytes. Normal renal function. Troponin 0.12. CAT scan of the head shows no acute abnormality. Chest x-ray posterior right 9th rib fracture. CT cervical spine no acute osseous injury. White count 9.5, hemoglobin 16, hematocrit 49.4, platelets 120.  ASSESSMENT AND PLAN: This is a 69 year old female with history of hypertension, chronic pain, and polysubstance abuse who was brought in by son for altered mental status and wants placement for his mother.  1. Change in mental status/encephalopathy, possibly multifactorial and related to the patient's drug abuse where she uses cocaine and drinks daily. Her urine drug screen is currently pending. Will hold her sedating medications including Neurontin and trazodone.   2. Accelerated hypertension. Will increase the dose of the patient's quinapril. Start patient on labetalol given her cocaine use. Continue Lasix. Add amlodipine and p.r.n. hydralazine.  3. Hyperglycemia,  possibly reactive. The patient has no history of diabetes.  4. Mild dehydration with elevated BUN. Will be treated with IV fluids.  5. Thrombocytopenia possibly related to the patient's alcohol abuse. Will monitor closely.  6. Elevated troponin. The patient denies any chest pain. This could be related to demand ischemia from accelerated hypertension versus cocaine use. Will monitor. Will continue to follow two more sets of cardiac enzymes.  7. Polysubstance abuse. The patient has history of alcohol abuse, smoking, and cocaine abuse. The patient was counseled about cessation for more than three minutes. Will get a Psychiatry consultation since the son is concerned about his mother living alone by herself. He thinks that she is incompetent to make a decision and would like a psychiatric evaluation.  8. Generalized weakness. Will obtain a PT consultation.   9. Will obtain a Case Management consultation for disposition. 10. Will place on GI and DVT prophylaxis.  Reviewed old medical records, discussed with the ED physician, discussed with the patient's son the plan of care and management.   TIME SPENT: 75 minutes.   ____________________________ Darrick Meigs, MD sp:drc D: 11/22/2011 21:58:44 ET T: 11/23/2011 07:18:00 ET JOB#: [id number]  cc: Darrick Meigs, MD, <Dictator>, [name], MD [facility]  MD ELECTRONICALLY SIGNED 11/24/2011 20:43

## 2015-12-27 ENCOUNTER — Ambulatory Visit
Admission: RE | Admit: 2015-12-27 | Discharge: 2015-12-27 | Disposition: A | Discharge status: Home / Self Care | Payer: Medicare Other | Source: Ambulatory Visit | Attending: Internal Medicine | Admitting: Internal Medicine

## 2015-12-27 ENCOUNTER — Other Ambulatory Visit: Payer: Self-pay | Admitting: Internal Medicine

## 2015-12-27 DIAGNOSIS — M545 Low back pain: Secondary | ICD-10-CM

## 2015-12-27 DIAGNOSIS — M438X6 Other specified deforming dorsopathies, lumbar region: Secondary | ICD-10-CM | POA: Insufficient documentation

## 2015-12-27 DIAGNOSIS — Z1231 Encounter for screening mammogram for malignant neoplasm of breast: Secondary | ICD-10-CM

## 2016-01-05 ENCOUNTER — Other Ambulatory Visit: Payer: Self-pay | Admitting: Gastroenterology

## 2016-01-05 DIAGNOSIS — B182 Chronic viral hepatitis C: Secondary | ICD-10-CM

## 2016-01-07 ENCOUNTER — Other Ambulatory Visit: Payer: Self-pay | Admitting: Internal Medicine

## 2016-01-07 ENCOUNTER — Ambulatory Visit
Admission: RE | Admit: 2016-01-07 | Discharge: 2016-01-07 | Disposition: A | Discharge status: Home / Self Care | Payer: Medicare Other | Source: Ambulatory Visit | Attending: Internal Medicine | Admitting: Internal Medicine

## 2016-01-07 DIAGNOSIS — Z1231 Encounter for screening mammogram for malignant neoplasm of breast: Secondary | ICD-10-CM

## 2016-01-10 ENCOUNTER — Ambulatory Visit: Payer: Medicare Other

## 2016-01-11 ENCOUNTER — Ambulatory Visit: Payer: Medicare Other

## 2016-02-11 ENCOUNTER — Ambulatory Visit
Admission: RE | Admit: 2016-02-11 | Discharge: 2016-02-11 | Disposition: A | Discharge status: Home / Self Care | Payer: Medicare Other | Source: Ambulatory Visit | Attending: Gastroenterology | Admitting: Gastroenterology

## 2016-02-11 DIAGNOSIS — R768 Other specified abnormal immunological findings in serum: Secondary | ICD-10-CM | POA: Insufficient documentation

## 2016-02-11 DIAGNOSIS — B182 Chronic viral hepatitis C: Secondary | ICD-10-CM | POA: Diagnosis not present

## 2017-04-13 ENCOUNTER — Other Ambulatory Visit: Payer: Self-pay | Admitting: Internal Medicine

## 2017-04-13 DIAGNOSIS — Z1231 Encounter for screening mammogram for malignant neoplasm of breast: Secondary | ICD-10-CM

## 2017-05-11 ENCOUNTER — Other Ambulatory Visit: Payer: Self-pay | Admitting: Student

## 2017-05-11 DIAGNOSIS — B182 Chronic viral hepatitis C: Secondary | ICD-10-CM

## 2017-05-18 ENCOUNTER — Ambulatory Visit: Admission: RE | Admit: 2017-05-18 | Payer: Medicare Other | Source: Ambulatory Visit

## 2019-11-13 ENCOUNTER — Other Ambulatory Visit: Payer: Self-pay

## 2019-11-13 ENCOUNTER — Encounter: Payer: Self-pay | Admitting: Emergency Medicine

## 2019-11-13 ENCOUNTER — Emergency Department
Admission: EM | Admit: 2019-11-13 | Discharge: 2019-11-13 | Disposition: A | Discharge status: Home / Self Care | Payer: Medicare Other | Attending: Emergency Medicine | Admitting: Emergency Medicine

## 2019-11-13 ENCOUNTER — Emergency Department: Payer: Medicare Other

## 2019-11-13 DIAGNOSIS — F17210 Nicotine dependence, cigarettes, uncomplicated: Secondary | ICD-10-CM | POA: Diagnosis not present

## 2019-11-13 DIAGNOSIS — R05 Cough: Secondary | ICD-10-CM | POA: Insufficient documentation

## 2019-11-13 DIAGNOSIS — I10 Essential (primary) hypertension: Secondary | ICD-10-CM | POA: Diagnosis not present

## 2019-11-13 DIAGNOSIS — R079 Chest pain, unspecified: Secondary | ICD-10-CM | POA: Insufficient documentation

## 2019-11-13 LAB — BASIC METABOLIC PANEL
Anion gap: 6 (ref 5–15)
BUN: 24 mg/dL — ABNORMAL HIGH (ref 8–23)
CO2: 30 mmol/L (ref 22–32)
Calcium: 9.7 mg/dL (ref 8.9–10.3)
Chloride: 104 mmol/L (ref 98–111)
Creatinine, Ser: 1.13 mg/dL — ABNORMAL HIGH (ref 0.44–1.00)
GFR calc Af Amer: 55 mL/min — ABNORMAL LOW (ref >60)
GFR calc non Af Amer: 48 mL/min — ABNORMAL LOW (ref >60)
Glucose, Bld: 141 mg/dL — ABNORMAL HIGH (ref 70–99)
Potassium: 3.9 mmol/L (ref 3.5–5.1)
Sodium: 140 mmol/L (ref 135–145)

## 2019-11-13 LAB — CBC
HCT: 42 % (ref 36.0–46.0)
Hemoglobin: 13 g/dL (ref 12.0–15.0)
MCH: 25.8 pg — ABNORMAL LOW (ref 26.0–34.0)
MCHC: 31 g/dL (ref 30.0–36.0)
MCV: 83.5 fL (ref 80.0–100.0)
Platelets: 251 10*3/uL (ref 150–400)
RBC: 5.03 MIL/uL (ref 3.87–5.11)
RDW: 14.8 % (ref 11.5–15.5)
WBC: 5.9 10*3/uL (ref 4.0–10.5)
nRBC: 0 % (ref 0.0–0.2)

## 2019-11-13 LAB — TROPONIN I (HIGH SENSITIVITY)
Troponin I (High Sensitivity): 5 ng/L (ref <18)
Troponin I (High Sensitivity): 6 ng/L (ref <18)

## 2019-11-13 MED ORDER — SODIUM CHLORIDE 0.9% FLUSH: 3.0000 mL | Freq: Once | INTRAVENOUS | Status: DC

## 2019-11-13 NOTE — ED Triage Notes (Signed)
Pt here via EMS from home with c/o intermittent cp without shob since this AM. Denies SHOB. EMS gave 1 SL nitro and 4 baby asa given en route. Pt in no distress at this time. Mild dementia per EMS.

## 2019-11-13 NOTE — ED Notes (Signed)
Daughter called for pt at this time. Daughter updated on pt's plan of care and discharge instructions and verbalized understanding and has no questions.

## 2019-11-13 NOTE — ED Notes (Signed)
First nurse note: here via EMS from home with cp, 136/90; 1 ntg given in truck with pain reliefpain now 5/10 , 4 baby asa given as well.  22g Lfa, ; hr 64, NSR, cbg 164, mild dementia per EMS.

## 2019-11-13 NOTE — ED Notes (Signed)
Patient given ED sandwich tray per Dr. Marisa Severin.

## 2019-11-13 NOTE — ED Notes (Signed)
Pt is very confused as to why she is here and how she got here. Calling to see if pts daughter can sit with her in the lobby as she meets ALONE criteria.

## 2019-11-13 NOTE — ED Provider Notes (Signed)
-----------------------------------------   3:03 PM on 11/13/2019 -----------------------------------------  Blood pressure 135/63, pulse 64, temperature 98.5 F (36.9 C), temperature source Oral, resp. rate 15, height 5\' 6"  (1.676 m), weight 68 kg, SpO2 100 %.  Assuming care from Dr. .  In short, Carla Stanley is a 74 y.o. female with a chief complaint of Chest Pain and Cough .  Refer to the original H&P for additional details.  The current plan of care is to follow-up repeat troponin, plan for dc home if negative.  Repeat troponin within normal limits, patient requesting to be discharged home at this time, remains calm and comfortable.  I have counseled her to follow-up with her PCP and otherwise return to the ED for new or worsening symptoms.  Patient agrees to plan.    59, MD 11/13/19 1718

## 2019-11-13 NOTE — Discharge Instructions (Signed)
Follow-up with your primary care doctor within the next 1 to 2 weeks.  Return to the ER for new, worsening, or persistent severe chest pain, difficulty breathing, weakness, or any other new or worsening symptoms that concern you.

## 2019-11-13 NOTE — ED Notes (Signed)
Patient assisted back into stretcher. Patient was informed that her daughter was coming to pick her up. Patient was agreeable at this time to waiting for daughter before getting dressed.

## 2019-11-13 NOTE — ED Notes (Signed)
Pt in bathroom, was unable to wait to do EKG.

## 2019-11-13 NOTE — ED Notes (Signed)
Patient assisted to room commode. Patient had a steady gait.

## 2019-11-13 NOTE — ED Provider Notes (Addendum)
[facility] Emergency Department Provider Note ____________________________________________   First MD Initiated Contact with Patient 11/13/19 1503     (approximate)  I have reviewed the triage vital signs and the nursing notes.   HISTORY  Chief Complaint Chest Pain and Cough    HPI Carla Stanley is a 74 y.o. female with a PMH of hepatitis C, polysubstance abuse, stroke, hypertension, and apparent baseline altered mental status or dementia who presents with chest pain and some tingling in her arms and legs.  EMS reported that the patient was brought in for chest pain today, and received aspirin and nitroglycerin.  However, the patient states that she has had chest pain on and off for a month.  She also reports some tingling in both arms and legs which has been intermittent for months.  She denies shortness of breath or other acute complaints.  History reviewed. No pertinent past medical history.  There are no problems to display for this patient.   History reviewed. No pertinent surgical history.  Prior to Admission medications   Not on File    Allergies Patient has no known allergies.  No family history on file.  Social History Social History   Tobacco Use  . Smoking status: Current Some Day Smoker  . Smokeless tobacco: Never Used  Substance Use Topics  . Alcohol use: Not Currently  . Drug use: Not on file    Review of Systems  Constitutional: No fever. Eyes: No redness. ENT: No sore throat. Cardiovascular: Positive for chest pain. Respiratory: Denies shortness of breath. Gastrointestinal: No vomiting. Genitourinary: Negative for flank pain.  Musculoskeletal: Negative for back pain.  Positive for bilateral arm and leg pain. Skin: Negative for rash. Neurological: Negative for headaches, focal weakness or numbness.   ____________________________________________   PHYSICAL EXAM:  VITAL SIGNS: ED Triage Vitals  Enc Vitals Group       BP 11/13/19 1209 125/67     Pulse Rate 11/13/19 1209 63     Resp 11/13/19 1209 16     Temp 11/13/19 1209 98.5 F (36.9 C)     Temp Source 11/13/19 1209 Oral     SpO2 11/13/19 1209 100 %     Weight 11/13/19 1155 150 lb (68 kg)     Height 11/13/19 1155 5\' 6"  (1.676 m)     Head Circumference --      Peak Flow --      Pain Score 11/13/19 1155 5     Pain Loc --      Pain Edu? --      Excl. in GC? --     Constitutional: Alert and oriented.  Relatively well appearing and in no acute distress. Eyes: Conjunctivae are normal.  Head: Atraumatic. Nose: No congestion/rhinnorhea. Mouth/Throat: Mucous membranes are moist.   Neck: Normal range of motion.  Cardiovascular: Normal rate, regular rhythm. Grossly normal heart sounds.  Good peripheral circulation.  No chest wall tenderness. Respiratory: Normal respiratory effort.  No retractions. Lungs CTAB. Gastrointestinal: Soft and nontender. No distention.  Genitourinary: No CVA tenderness. Musculoskeletal: No lower extremity edema.  Extremities warm and well perfused.  Neurologic:  Normal speech and language.  5/5 motor strength and intact sensation all extremities. Skin:  Skin is warm and dry. No rash noted. Psychiatric: Mood and affect are normal. Speech and behavior are normal.  ____________________________________________   LABS (all labs ordered are listed, but only abnormal results are displayed)  Labs Reviewed  BASIC METABOLIC PANEL - Abnormal; Notable  for the following components:      Result Value   Glucose, Bld 141 (*)    BUN 24 (*)    Creatinine, Ser 1.13 (*)    GFR calc non Af Amer 48 (*)    GFR calc Af Amer 55 (*)    All other components within normal limits  CBC - Abnormal; Notable for the following components:   MCH 25.8 (*)    All other components within normal limits  TROPONIN I (HIGH SENSITIVITY)  TROPONIN I (HIGH SENSITIVITY)   ____________________________________________  EKG  ED ECG REPORT I, Dionne Bucy, the attending physician, personally viewed and interpreted this ECG.  Date: 11/13/2019 EKG Time: 1206 Rate: 70 Rhythm: normal sinus rhythm QRS Axis: normal Intervals: normal ST/T Wave abnormalities: normal Narrative Interpretation: no evidence of acute ischemia  ____________________________________________  RADIOLOGY  CXR: No acute abnormality  ____________________________________________   PROCEDURES  Procedure(s) performed: No  Procedures  Critical Care performed: No ____________________________________________   INITIAL IMPRESSION / ASSESSMENT AND PLAN / ED COURSE  Pertinent labs & imaging results that were available during my care of the patient were reviewed by me and considered in my medical decision making (see chart for details).  74 year old female with PMH as noted above including a history of a stroke and some apparent baseline dementia or altered mental status presents with chest pain which she states has been going on intermittently for a while, as well as with some arm and leg tingling.  I reviewed the past medical records in Epic.  The patient has no prior ED visits or admissions here since 2013 when she was admitted for altered mental status and polysubstance abuse.  She does have fairly recent cardiology and gastroenterology notes for outpatient visits available in Care Everywhere.  I do not see any documented history of coronary artery disease or other heart problems.  On exam, the patient is comfortable appearing.  She initially told me she was here for arm and leg pain and does appear mildly confused answering some questions but is oriented x4.  Her vital signs are normal.  Neurologic exam is nonfocal.  The extremities appear warm and well perfused.  The remainder of the physical exam is unremarkable.  The patient is chest pain-free at this time.  Overall presentation is consistent with musculoskeletal pain, GERD, or other benign etiology.  Given  the resolved pain, the normal EKG, and the lack of significant associated symptoms, I do not suspect ACS.  There is no clinical evidence for PE or DVT.  There is also no hypertension, tachycardia, or radiating pain suggestive of aortic dissection or other vascular cause.  The arm and leg tingling is nonspecific and has apparently been going on for some time.  She denies any actual weakness or numbness, and her neurologic exam is nonfocal.  Extremities are warm and well perfused.  She could be having some subacute or chronic peripheral neuropathy.  There is no indication for further ED work-up for this.  Initial labs including a troponin are negative.  We will obtain a repeat troponin and if this is negative and the patient remains pain-free, I anticipate discharge home.  ----------------------------------------- 3:34 PM on 11/13/2019 -----------------------------------------  Patient is pending repeat troponin.  I have signed her out to Dr. Larinda Buttery.  ____________________________________________   FINAL CLINICAL IMPRESSION(S) / ED DIAGNOSES  Final diagnoses:  Chest pain, unspecified type      NEW MEDICATIONS STARTED DURING THIS VISIT:  New Prescriptions   No medications  on file     Note:  This document was prepared using Dragon voice recognition software and may include unintentional dictation errors.    Arta Silence, MD 11/13/19 1533    Arta Silence, MD 11/13/19 1534

## 2019-11-13 NOTE — ED Notes (Signed)
Patient is calm, pleasant, speaks easily in complete sentences. NAD. No dyspnea noted.

## 2019-11-13 NOTE — ED Notes (Signed)
Pt assisted to toilet. Pt ambulatory with steady gait. Pt assisted back to bed and resting comfortably

## 2019-11-13 NOTE — ED Notes (Signed)
Patient found out of bed and sitting on stool. Patient states she is ready to go home. Patient assisted back on stretcher. Patient would only allow pulse ox on. Patient remains pleasant.

## 2020-04-14 ENCOUNTER — Other Ambulatory Visit: Payer: Self-pay

## 2020-04-14 ENCOUNTER — Emergency Department
Admission: EM | Admit: 2020-04-14 | Discharge: 2020-04-14 | Disposition: A | Discharge status: Home / Self Care | Payer: Medicare HMO | Attending: Emergency Medicine | Admitting: Emergency Medicine

## 2020-04-14 ENCOUNTER — Emergency Department: Payer: Medicare HMO

## 2020-04-14 DIAGNOSIS — R0602 Shortness of breath: Secondary | ICD-10-CM | POA: Diagnosis not present

## 2020-04-14 DIAGNOSIS — Z5321 Procedure and treatment not carried out due to patient leaving prior to being seen by health care provider: Secondary | ICD-10-CM | POA: Insufficient documentation

## 2020-04-14 DIAGNOSIS — R079 Chest pain, unspecified: Secondary | ICD-10-CM | POA: Insufficient documentation

## 2020-04-14 HISTORY — DX: Essential (primary) hypertension: I10

## 2020-04-14 LAB — BASIC METABOLIC PANEL
Anion gap: 6 (ref 5–15)
BUN: 15 mg/dL (ref 8–23)
CO2: 27 mmol/L (ref 22–32)
Calcium: 8.8 mg/dL — ABNORMAL LOW (ref 8.9–10.3)
Chloride: 107 mmol/L (ref 98–111)
Creatinine, Ser: 1.14 mg/dL — ABNORMAL HIGH (ref 0.44–1.00)
GFR calc Af Amer: 54 mL/min — ABNORMAL LOW (ref >60)
GFR calc non Af Amer: 47 mL/min — ABNORMAL LOW (ref >60)
Glucose, Bld: 108 mg/dL — ABNORMAL HIGH (ref 70–99)
Potassium: 3.9 mmol/L (ref 3.5–5.1)
Sodium: 140 mmol/L (ref 135–145)

## 2020-04-14 LAB — CBC
HCT: 40.2 % (ref 36.0–46.0)
Hemoglobin: 13 g/dL (ref 12.0–15.0)
MCH: 26.5 pg (ref 26.0–34.0)
MCHC: 32.3 g/dL (ref 30.0–36.0)
MCV: 81.9 fL (ref 80.0–100.0)
Platelets: 221 10*3/uL (ref 150–400)
RBC: 4.91 MIL/uL (ref 3.87–5.11)
RDW: 15 % (ref 11.5–15.5)
WBC: 5.7 10*3/uL (ref 4.0–10.5)
nRBC: 0 % (ref 0.0–0.2)

## 2020-04-14 LAB — TROPONIN I (HIGH SENSITIVITY): Troponin I (High Sensitivity): 6 ng/L (ref <18)

## 2020-04-14 MED ORDER — SODIUM CHLORIDE 0.9% FLUSH: 3.0000 mL | Freq: Once | INTRAVENOUS | Status: DC

## 2020-04-14 NOTE — ED Triage Notes (Signed)
Pt in via EMS from home with c/o CP, non radiating since this am. Denies SOB, NV. # 20g to left wrist, EMS administered 324 mg asa and 1 sub lingual nitro with no relief.

## 2020-04-14 NOTE — ED Triage Notes (Signed)
See first nurse note. Pt reports chest pain has subsided but c/o shob. Pt speaking in complete sentences without difficulty. Skin warm and dry. NAD noted.

## 2020-04-14 NOTE — ED Notes (Signed)
Pt and family reports they are leaving to go elsewhere due to wait

## 2020-04-15 ENCOUNTER — Telehealth: Payer: Self-pay | Admitting: Emergency Medicine

## 2021-09-08 DIAGNOSIS — N289 Disorder of kidney and ureter, unspecified: Secondary | ICD-10-CM | POA: Diagnosis not present

## 2021-09-08 DIAGNOSIS — R5383 Other fatigue: Secondary | ICD-10-CM | POA: Diagnosis not present

## 2021-09-08 DIAGNOSIS — E785 Hyperlipidemia, unspecified: Secondary | ICD-10-CM | POA: Diagnosis not present

## 2021-09-08 DIAGNOSIS — E559 Vitamin D deficiency, unspecified: Secondary | ICD-10-CM | POA: Diagnosis not present

## 2021-09-08 DIAGNOSIS — R739 Hyperglycemia, unspecified: Secondary | ICD-10-CM | POA: Diagnosis not present

## 2021-09-08 DIAGNOSIS — G4700 Insomnia, unspecified: Secondary | ICD-10-CM | POA: Diagnosis not present

## 2021-09-08 DIAGNOSIS — D509 Iron deficiency anemia, unspecified: Secondary | ICD-10-CM | POA: Diagnosis not present

## 2021-09-08 DIAGNOSIS — I10 Essential (primary) hypertension: Secondary | ICD-10-CM | POA: Diagnosis not present

## 2021-09-08 DIAGNOSIS — M129 Arthropathy, unspecified: Secondary | ICD-10-CM | POA: Diagnosis not present

## 2021-09-12 DIAGNOSIS — E559 Vitamin D deficiency, unspecified: Secondary | ICD-10-CM | POA: Diagnosis not present

## 2021-09-12 DIAGNOSIS — R739 Hyperglycemia, unspecified: Secondary | ICD-10-CM | POA: Diagnosis not present

## 2021-09-12 DIAGNOSIS — D509 Iron deficiency anemia, unspecified: Secondary | ICD-10-CM | POA: Diagnosis not present

## 2021-09-12 DIAGNOSIS — E785 Hyperlipidemia, unspecified: Secondary | ICD-10-CM | POA: Diagnosis not present

## 2021-09-12 DIAGNOSIS — N289 Disorder of kidney and ureter, unspecified: Secondary | ICD-10-CM | POA: Diagnosis not present

## 2021-09-12 DIAGNOSIS — R5383 Other fatigue: Secondary | ICD-10-CM | POA: Diagnosis not present

## 2022-06-30 DEATH — deceased
